# Patient Record
Sex: Female | Born: 1985 | Race: Black or African American | Hispanic: No | Marital: Single | Smoking: Current every day smoker
Health system: Southern US, Community
[De-identification: ages and names within clinical notes are randomized; demographics above are authoritative.]

---

## 2015-06-09 HISTORY — PX: TONSILLECTOMY: SUR1361

## 2018-10-05 ENCOUNTER — Other Ambulatory Visit: Payer: Self-pay

## 2018-10-05 ENCOUNTER — Emergency Department (HOSPITAL_COMMUNITY): Payer: Medicaid Other

## 2018-10-05 ENCOUNTER — Emergency Department (HOSPITAL_COMMUNITY)
Admission: EM | Admit: 2018-10-05 | Discharge: 2018-10-05 | Disposition: A | Discharge status: Home / Self Care | Payer: Medicaid Other | Attending: Emergency Medicine | Admitting: Emergency Medicine

## 2018-10-05 ENCOUNTER — Encounter (HOSPITAL_COMMUNITY): Payer: Self-pay

## 2018-10-05 DIAGNOSIS — R079 Chest pain, unspecified: Secondary | ICD-10-CM | POA: Diagnosis present

## 2018-10-05 DIAGNOSIS — R0602 Shortness of breath: Secondary | ICD-10-CM | POA: Diagnosis not present

## 2018-10-05 DIAGNOSIS — J189 Pneumonia, unspecified organism: Secondary | ICD-10-CM | POA: Insufficient documentation

## 2018-10-05 LAB — COMPREHENSIVE METABOLIC PANEL
ALT: 10 U/L (ref 0–44)
AST: 13 U/L — ABNORMAL LOW (ref 15–41)
Albumin: 3.8 g/dL (ref 3.5–5.0)
Alkaline Phosphatase: 42 U/L (ref 38–126)
Anion gap: 6 (ref 5–15)
BUN: 8 mg/dL (ref 6–20)
CO2: 24 mmol/L (ref 22–32)
Calcium: 10.2 mg/dL (ref 8.9–10.3)
Chloride: 110 mmol/L (ref 98–111)
Creatinine, Ser: 0.85 mg/dL (ref 0.44–1.00)
GFR calc Af Amer: >60 mL/min (ref >60)
GFR calc non Af Amer: >60 mL/min (ref >60)
Glucose, Bld: 108 mg/dL — ABNORMAL HIGH (ref 70–99)
Potassium: 4 mmol/L (ref 3.5–5.1)
Sodium: 140 mmol/L (ref 135–145)
Total Bilirubin: 0.4 mg/dL (ref 0.3–1.2)
Total Protein: 7 g/dL (ref 6.5–8.1)

## 2018-10-05 LAB — CBC WITH DIFFERENTIAL/PLATELET
Abs Immature Granulocytes: 0.06 10*3/uL (ref 0.00–0.07)
Basophils Absolute: 0 10*3/uL (ref 0.0–0.1)
Basophils Relative: 0 %
Eosinophils Absolute: 0.1 10*3/uL (ref 0.0–0.5)
Eosinophils Relative: 1 %
HCT: 37.8 % (ref 36.0–46.0)
Hemoglobin: 13.4 g/dL (ref 12.0–15.0)
Immature Granulocytes: 0 %
Lymphocytes Relative: 27 %
Lymphs Abs: 3.6 10*3/uL (ref 0.7–4.0)
MCH: 29.8 pg (ref 26.0–34.0)
MCHC: 35.4 g/dL (ref 30.0–36.0)
MCV: 84.2 fL (ref 80.0–100.0)
Monocytes Absolute: 1.1 10*3/uL — ABNORMAL HIGH (ref 0.1–1.0)
Monocytes Relative: 8 %
Neutro Abs: 8.5 10*3/uL — ABNORMAL HIGH (ref 1.7–7.7)
Neutrophils Relative %: 64 %
Platelets: 281 10*3/uL (ref 150–400)
RBC: 4.49 MIL/uL (ref 3.87–5.11)
RDW: 13.9 % (ref 11.5–15.5)
WBC: 13.4 10*3/uL — ABNORMAL HIGH (ref 4.0–10.5)
nRBC: 0 % (ref 0.0–0.2)

## 2018-10-05 LAB — TROPONIN I: Troponin I: <0.03 ng/mL (ref <0.03)

## 2018-10-05 LAB — D-DIMER, QUANTITATIVE: D-Dimer, Quant: 0.47 ug/mL-FEU (ref 0.00–0.50)

## 2018-10-05 MED ORDER — ALBUTEROL SULFATE HFA 108 (90 BASE) MCG/ACT IN AERS
2.0000 | INHALATION_SPRAY | Freq: Once | RESPIRATORY_TRACT | Status: AC
  Administered 2018-10-05: 05:00:00 2 via RESPIRATORY_TRACT
  Filled 2018-10-05: qty 6.7

## 2018-10-05 MED ORDER — HYDROCOD POLST-CPM POLST ER 10-8 MG/5ML PO SUER: 5.0000 mL | Freq: Two times a day (BID) | ORAL | 0 refills | Status: AC | PRN

## 2018-10-05 MED ORDER — HYDROMORPHONE HCL 1 MG/ML IJ SOLN
0.5000 mg | Freq: Once | INTRAMUSCULAR | Status: AC
  Administered 2018-10-05: 0.5 mg via INTRAVENOUS
  Filled 2018-10-05: qty 1

## 2018-10-05 MED ORDER — HYDROCOD POLST-CPM POLST ER 10-8 MG/5ML PO SUER
5.0000 mL | Freq: Once | ORAL | Status: AC
  Administered 2018-10-05: 5 mL via ORAL
  Filled 2018-10-05: qty 5

## 2018-10-05 MED ORDER — ONDANSETRON HCL 4 MG/2ML IJ SOLN
4.0000 mg | Freq: Once | INTRAMUSCULAR | Status: AC
  Administered 2018-10-05: 4 mg via INTRAVENOUS
  Filled 2018-10-05: qty 2

## 2018-10-05 MED ORDER — SODIUM CHLORIDE 0.9 % IV SOLN
1.0000 g | Freq: Once | INTRAVENOUS | Status: AC
  Administered 2018-10-05: 1 g via INTRAVENOUS
  Filled 2018-10-05: qty 10

## 2018-10-05 MED ORDER — ALBUTEROL SULFATE HFA 108 (90 BASE) MCG/ACT IN AERS: 1.0000 | INHALATION_SPRAY | Freq: Four times a day (QID) | RESPIRATORY_TRACT | 0 refills | Status: AC | PRN

## 2018-10-05 MED ORDER — AZITHROMYCIN 250 MG PO TABS: 250.0000 mg | ORAL_TABLET | Freq: Every day | ORAL | 0 refills | Status: AC

## 2018-10-05 MED ORDER — SODIUM CHLORIDE 0.9 % IV SOLN
500.0000 mg | Freq: Once | INTRAVENOUS | Status: AC
  Administered 2018-10-05: 500 mg via INTRAVENOUS
  Filled 2018-10-05: qty 500

## 2018-10-05 NOTE — ED Provider Notes (Signed)
[facility]-EMERGENCY DEPT Provider Note   CSN: [id number] Arrival date & time: 10/05/18  0327    History   Chief Complaint Chief Complaint  Patient presents with  . Chest Pain  . Shortness of Breath    HPI Michelle Silva is a 33 y.o. female.     Patient to ED with complaint of chest pain for the past 3 days. It started as bilateral chest pain and is now right sided and radiates to the back. She started feeling short of breath yesterday and reports when she lies down the pain and SOB become worse. She denies fever at any point. She reports having a minimal cough that is also worse when supine. No nausea, vomiting, congestion, sore throat or headache. She states she has been "isolating and socially distancing", however, also states she has been visiting with friends who have come to her house. She denies that any one has been symptomatic with URI symptoms or fever. She is a smoker. She is not on birth control, has no history of clots and has not been traveling.   The history is provided by the patient. No language interpreter was used.  Chest Pain  Associated symptoms: cough and shortness of breath   Associated symptoms: no abdominal pain, no fever, no headache, no nausea, no vomiting and no weakness   Shortness of Breath  Associated symptoms: chest pain and cough   Associated symptoms: no abdominal pain, no fever, no headaches, no rash, no sore throat and no vomiting     History reviewed. No pertinent past medical history.  There are no active problems to display for this patient.   Past Surgical History:  Procedure Laterality Date  . TONSILLECTOMY  2017     OB History    Gravida  3   Para  1   Term      Preterm      AB      Living        SAB      TAB      Ectopic      Multiple      Live Births               Home Medications    Prior to Admission medications   Not on File    Family History Family History  Problem  Relation Age of Onset  . Hypertension Mother   . Hypertension Father   . Diabetes Father   . Cancer Father     Social History Social History   Tobacco Use  . Smoking status: Current Every Day Smoker    Packs/day: 0.50    Years: 10.00    Pack years: 5.00    Types: Cigarettes  . Smokeless tobacco: Never Used  Substance Use Topics  . Alcohol use: Never    Frequency: Never  . Drug use: Yes    Types: Marijuana     Allergies   Patient has no allergy information on record.   Review of Systems Review of Systems  Constitutional: Negative for appetite change, chills and fever.  HENT: Negative.  Negative for congestion and sore throat.   Respiratory: Positive for cough and shortness of breath.   Cardiovascular: Positive for chest pain. Negative for leg swelling.  Gastrointestinal: Negative for abdominal pain, nausea and vomiting.  Musculoskeletal: Negative for myalgias.  Skin: Negative for rash.  Neurological: Negative.  Negative for weakness and headaches.     Physical Exam Updated Vital Signs BP  132/88 (BP Location: Right Arm)   Pulse 69   Temp 98.6 F (37 C) (Oral)   Resp (!) 24   Ht 5\' 2"  (1.575 m)   Wt 86.2 kg   LMP  (LMP Unknown)   SpO2 97%   BMI 34.75 kg/m   Physical Exam Vitals signs and nursing note reviewed.  Constitutional:      Appearance: She is well-developed.  HENT:     Head: Normocephalic.     Mouth/Throat:     Mouth: Mucous membranes are moist.     Pharynx: Oropharynx is clear.  Neck:     Musculoskeletal: Normal range of motion and neck supple.  Cardiovascular:     Rate and Rhythm: Normal rate and regular rhythm.     Heart sounds: No murmur.  Pulmonary:     Comments: Poor respiratory effort. No apparent wheezing, rales or rhonchi.  Chest:     Chest wall: Tenderness (Right sided chest wall tenderness. ) present.  Abdominal:     General: Bowel sounds are normal.     Palpations: Abdomen is soft.     Tenderness: There is no abdominal  tenderness. There is no guarding or rebound.  Musculoskeletal: Normal range of motion.        General: No swelling or tenderness.  Skin:    General: Skin is warm and dry.     Findings: No rash.  Neurological:     Mental Status: She is alert and oriented to person, place, and time.      ED Treatments / Results  Labs (all labs ordered are listed, but only abnormal results are displayed) Labs Reviewed  CBC WITH DIFFERENTIAL/PLATELET - Abnormal; Notable for the following components:      Result Value   WBC 13.4 (*)    Neutro Abs 8.5 (*)    Monocytes Absolute 1.1 (*)    All other components within normal limits  COMPREHENSIVE METABOLIC PANEL - Abnormal; Notable for the following components:   Glucose, Bld 108 (*)    AST 13 (*)    All other components within normal limits  D-DIMER, QUANTITATIVE (NOT AT [facility])  TROPONIN I   Results for orders placed or performed during the hospital encounter of 10/05/18  CBC with Differential  Result Value Ref Range   WBC 13.4 (H) 4.0 - 10.5 K/uL   RBC 4.49 3.87 - 5.11 MIL/uL   Hemoglobin 13.4 12.0 - 15.0 g/dL   HCT 16.1 09.6 - 04.5 %   MCV 84.2 80.0 - 100.0 fL   MCH 29.8 26.0 - 34.0 pg   MCHC 35.4 30.0 - 36.0 g/dL   RDW 40.9 81.1 - 91.4 %   Platelets 281 150 - 400 K/uL   nRBC 0.0 0.0 - 0.2 %   Neutrophils Relative % 64 %   Neutro Abs 8.5 (H) 1.7 - 7.7 K/uL   Lymphocytes Relative 27 %   Lymphs Abs 3.6 0.7 - 4.0 K/uL   Monocytes Relative 8 %   Monocytes Absolute 1.1 (H) 0.1 - 1.0 K/uL   Eosinophils Relative 1 %   Eosinophils Absolute 0.1 0.0 - 0.5 K/uL   Basophils Relative 0 %   Basophils Absolute 0.0 0.0 - 0.1 K/uL   Immature Granulocytes 0 %   Abs Immature Granulocytes 0.06 0.00 - 0.07 K/uL  Comprehensive metabolic panel  Result Value Ref Range   Sodium 140 135 - 145 mmol/L   Potassium 4.0 3.5 - 5.1 mmol/L   Chloride 110 98 - 111 mmol/L  CO2 24 22 - 32 mmol/L   Glucose, Bld 108 (H) 70 - 99 mg/dL   BUN 8 6 - 20 mg/dL    Creatinine, Ser 1.61 0.44 - 1.00 mg/dL   Calcium 09.6 8.9 - 04.5 mg/dL   Total Protein 7.0 6.5 - 8.1 g/dL   Albumin 3.8 3.5 - 5.0 g/dL   AST 13 (L) 15 - 41 U/L   ALT 10 0 - 44 U/L   Alkaline Phosphatase 42 38 - 126 U/L   Total Bilirubin 0.4 0.3 - 1.2 mg/dL   GFR calc non Af Amer >60 >60 mL/min   GFR calc Af Amer >60 >60 mL/min   Anion gap 6 5 - 15  D-dimer, quantitative (not at [facility])  Result Value Ref Range   D-Dimer, Quant 0.47 0.00 - 0.50 ug/mL-FEU  Troponin I - Once  Result Value Ref Range   Troponin I <0.03 <0.03 ng/mL     EKG EKG Interpretation  Date/Time:  Wednesday October 05 2018 03:38:15 EDT Ventricular Rate:  74 PR Interval:    QRS Duration: 88 QT Interval:  353 QTC Calculation: 392 R Axis:   35 Text Interpretation:  Sinus arrhythmia Low voltage, precordial leads No old tracing to compare Confirmed by Ward, Baxter Hire [phone]) on 10/05/2018 4:23:23 AM   Radiology Dg Chest Portable 1 View  Result Date: 10/05/2018 CLINICAL DATA:  Shortness of breath.  Chest pain.  Cough. EXAM: PORTABLE CHEST 1 VIEW COMPARISON:  None. FINDINGS: The heart size is normal. Lung volumes are low bibasilar airspace disease is present. Small effusions are suspected. Upper lung fields are clear. IMPRESSION: 1. Predominantly lower lobe airspace disease bilaterally. Findings are concerning for viral or bacterial pneumonia. Electronically Signed   By: Marin Roberts M.D.   On: 10/05/2018 04:57    Procedures Procedures (including critical care time)  Medications Ordered in ED Medications  azithromycin (ZITHROMAX) 500 mg in sodium chloride 0.9 % 250 mL IVPB (500 mg Intravenous New Bag/Given 10/05/18 0541)  ondansetron (ZOFRAN) injection 4 mg (has no administration in time range)  chlorpheniramine-HYDROcodone (TUSSIONEX) 10-8 MG/5ML suspension 5 mL (5 mLs Oral Given 10/05/18 0438)  albuterol (VENTOLIN HFA) 108 (90 Base) MCG/ACT inhaler 2 puff (2 puffs Inhalation Given 10/05/18 0438)  cefTRIAXone  (ROCEPHIN) 1 g in sodium chloride 0.9 % 100 mL IVPB (0 g Intravenous Stopped 10/05/18 0538)  HYDROmorphone (DILAUDID) injection 0.5 mg (0.5 mg Intravenous Given 10/05/18 0533)     Initial Impression / Assessment and Plan / ED Course  I have reviewed the triage vital signs and the nursing notes.  Pertinent labs & imaging results that were available during my care of the patient were reviewed by me and considered in my medical decision making (see chart for details).    Kierah Goatley was evaluated in the Emergency Department on 10/05/18 for the symptoms described in the history of present illness. She was evaluated in the context of the global COVID-19 pandemic, which necessitated consideration that the patient might be at risk for infection with the SARS-CoV-2 virus that causes COVID-19. Institutional protocols and algorithms that pertain to the evaluation of patients at risk for COVID-19 are in a state of rapid change based on information released by regulatory bodies including the CDC and federal and state organizations. These policies and algorithms were followed during the patient's care in the ED.      Patient to ED with 2-3 days of bilateral then unilater right chest pain, pain with respirations/SOB, worse with lying down. Minimal  cough. No fever, congestion, N, V.  The patient is well appearing. She is slightly tachypneic on arrival without tachycardia or hypoxia. Afebrile.   She is felt low risk for PE but d-dimer ordered with basic labs. EKG nonacute. Negative troponin. Mild leukocytosis.   CXR concerning for bibasilar opacities "viral vs bacterial PNA". Will treat for bacterial PNA, but have to consider COVID-19 likely. She is felt appropriate for discharge home to self quarantine. She will be given written instructions on parameters of self quarantine and social distancing.   The patient asked me to relay her workup and diagnosis concerns to her mother, Verlon Au, which was done in the  patient's presence.   All questions answered. Patient is appropriate for discharge home.   Final Clinical Impressions(s) / ED Diagnoses   Final diagnoses:  None   1. CAP  ED Discharge Orders    None       Elpidio Anis, Cordelia Poche 10/10/18 7829    Ward, Layla Maw, DO 10/10/18 [phone]

## 2018-10-05 NOTE — ED Triage Notes (Addendum)
Patient coming from home with complaints of right sided chest pain starting on Monday that is dull in nature. Patient states that the chest pain is sharp when taking in deep breaths. Pain does not radiate. Denies N/V/D, dizziness, and weakness. Patient is complaining of SOB that started on Tuesday. Patient is current smoker. Endorses nonproductive cough, but has been afebrile.

## 2018-10-05 NOTE — ED Provider Notes (Signed)
  Physical Exam  BP 116/78 (BP Location: Right Arm)   Pulse 86   Temp 98 F (36.7 C) (Oral)   Resp (!) 21   Ht 5\' 2"  (1.575 m)   Wt 86.2 kg   LMP  (LMP Unknown)   SpO2 94%   BMI 34.75 kg/m   Physical Exam  ED Course/Procedures     Procedures  MDM  Patient handed off to me from previous PA due to shift change. Patient with CP and SOB. Workup included EKG, ddimer and additional labs which were unremarkable. Chest xray concerning for pneumonia and was treated as CAP with rocephin and Zithromax. Patient was ambulated and remained 97-100% on RA and  Stable for discharge. Cannot r/o Covid 19. Michelle Silva was evaluated in Emergency Department on 10/05/2018 for the symptoms described in the history of present illness. She was evaluated in the context of the global COVID-19 pandemic, which necessitated consideration that the patient might be at risk for infection with the SARS-CoV-2 virus that causes COVID-19. Institutional protocols and algorithms that pertain to the evaluation of patients at risk for COVID-19 are in a state of rapid change based on information released by regulatory bodies including the CDC and federal and state organizations. These policies and algorithms were followed during the patient's care in the ED.        Arlyn Dunning, PA-C 10/05/18 7829    Benjiman Core, MD 10/05/18 1426

## 2018-10-05 NOTE — ED Notes (Signed)
X-ray at bedside

## 2018-10-05 NOTE — ED Notes (Signed)
Patient ambulated at 96-97% with no problems.

## 2018-10-05 NOTE — Discharge Instructions (Signed)
You have symptoms of an upper respiratory infection. Your chest x-ray is consistent with viral or bacterial pneumonia. We have placed you on antibiotics to cover for bacterial pneumonia but you have not been tested for COVID-19 so this cannot be definitely ruled out. The treatment for the virus is symptomatic - take Tussionex for cough, Tylenol and/or ibuprofen for any aches or if you develop a fever. You have been given directions for isolating appropriately to reduce your risk and the risk to others. Please review these instructions and follow them. If you develop severe pain, high fever, severe shortness of breath or new symptoms of concern, please return to the emergency department for further evaluation.

## 2019-12-26 IMAGING — DX PORTABLE CHEST - 1 VIEW
1 series · 1 of 1 positions shown · non-contrast
Comparison: None.

CLINICAL DATA: Shortness of breath.  Chest pain.  Cough.

EXAM:
PORTABLE CHEST 1 VIEW

[chest ap]
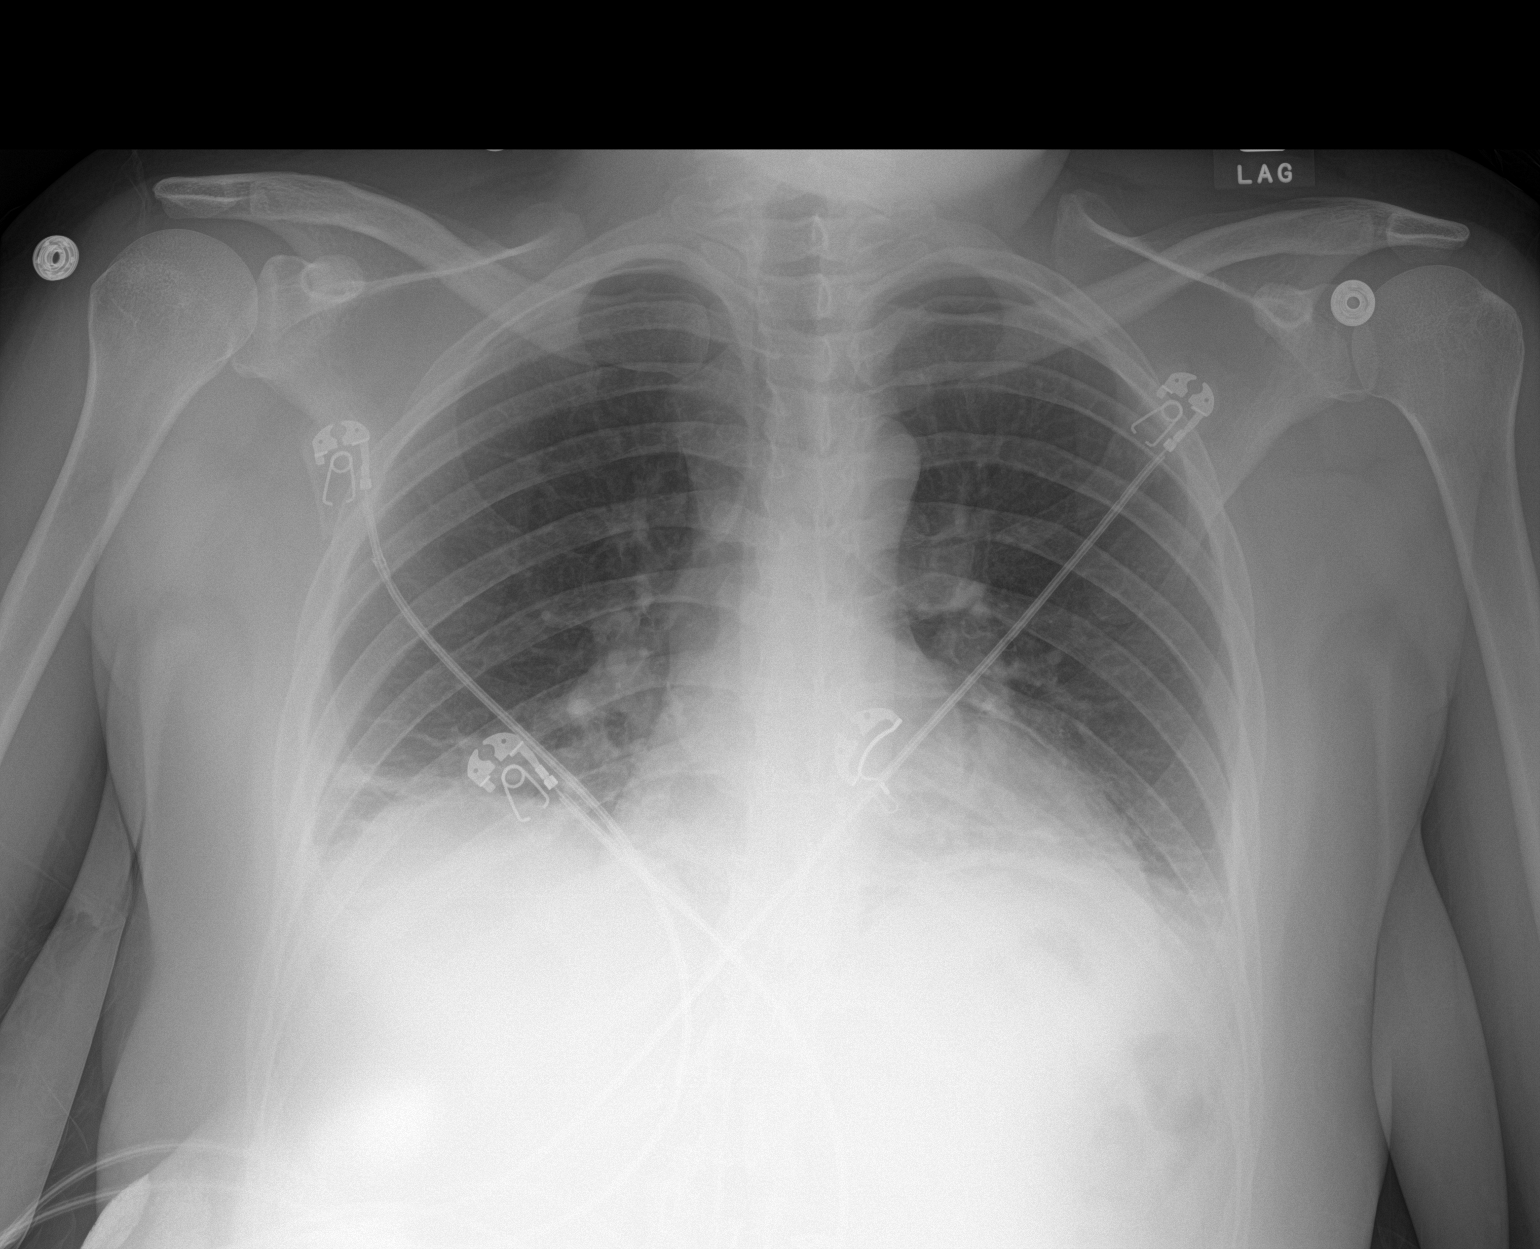

[1 of 1 positions shown; findings below may reference images not displayed]

FINDINGS: The heart size is normal. Lung volumes are low bibasilar airspace
disease is present. Small effusions are suspected. Upper lung fields
are clear.
IMPRESSION: 1. Predominantly lower lobe airspace disease bilaterally. Findings
are concerning for viral or bacterial pneumonia.
